# Patient Record
Sex: Male | Born: 1985 | Race: Black or African American | Hispanic: No | Marital: Single | Smoking: Never smoker
Health system: Southern US, Community
[De-identification: ages and names within clinical notes are randomized; demographics above are authoritative.]

---
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
## 2021-03-21 ENCOUNTER — Encounter (HOSPITAL_COMMUNITY): Payer: Self-pay

## 2021-03-21 ENCOUNTER — Emergency Department (HOSPITAL_COMMUNITY): Payer: Self-pay

## 2021-03-21 ENCOUNTER — Emergency Department (HOSPITAL_COMMUNITY)
Admission: EM | Admit: 2021-03-21 | Discharge: 2021-03-21 | Disposition: A | Discharge status: Home / Self Care | Payer: Self-pay | Attending: Emergency Medicine | Admitting: Emergency Medicine

## 2021-03-21 ENCOUNTER — Other Ambulatory Visit: Payer: Self-pay

## 2021-03-21 DIAGNOSIS — R112 Nausea with vomiting, unspecified: Secondary | ICD-10-CM | POA: Insufficient documentation

## 2021-03-21 DIAGNOSIS — Z5321 Procedure and treatment not carried out due to patient leaving prior to being seen by health care provider: Secondary | ICD-10-CM | POA: Insufficient documentation

## 2021-03-21 DIAGNOSIS — Z20822 Contact with and (suspected) exposure to covid-19: Secondary | ICD-10-CM | POA: Insufficient documentation

## 2021-03-21 DIAGNOSIS — R079 Chest pain, unspecified: Secondary | ICD-10-CM | POA: Insufficient documentation

## 2021-03-21 DIAGNOSIS — R5383 Other fatigue: Secondary | ICD-10-CM | POA: Insufficient documentation

## 2021-03-21 DIAGNOSIS — R197 Diarrhea, unspecified: Secondary | ICD-10-CM | POA: Insufficient documentation

## 2021-03-21 DIAGNOSIS — R0602 Shortness of breath: Secondary | ICD-10-CM | POA: Insufficient documentation

## 2021-03-21 LAB — RESP PANEL BY RT-PCR (FLU A&B, COVID) ARPGX2
Influenza A by PCR: NEGATIVE
Influenza B by PCR: NEGATIVE
SARS Coronavirus 2 by RT PCR: NEGATIVE

## 2021-03-21 LAB — COMPREHENSIVE METABOLIC PANEL
ALT: 42 U/L (ref 0–44)
AST: 35 U/L (ref 15–41)
Albumin: 4.2 g/dL (ref 3.5–5.0)
Alkaline Phosphatase: 62 U/L (ref 38–126)
Anion gap: 8 (ref 5–15)
BUN: 16 mg/dL (ref 6–20)
CO2: 27 mmol/L (ref 22–32)
Calcium: 9.1 mg/dL (ref 8.9–10.3)
Chloride: 103 mmol/L (ref 98–111)
Creatinine, Ser: 1.08 mg/dL (ref 0.61–1.24)
GFR, Estimated: >60 mL/min (ref >60)
Glucose, Bld: 114 mg/dL — ABNORMAL HIGH (ref 70–99)
Potassium: 4 mmol/L (ref 3.5–5.1)
Sodium: 138 mmol/L (ref 135–145)
Total Bilirubin: 0.9 mg/dL (ref 0.3–1.2)
Total Protein: 7.6 g/dL (ref 6.5–8.1)

## 2021-03-21 LAB — CBC WITH DIFFERENTIAL/PLATELET
Abs Immature Granulocytes: 0.04 10*3/uL (ref 0.00–0.07)
Basophils Absolute: 0 10*3/uL (ref 0.0–0.1)
Basophils Relative: 0 %
Eosinophils Absolute: 0.1 10*3/uL (ref 0.0–0.5)
Eosinophils Relative: 0 %
HCT: 45.1 % (ref 39.0–52.0)
Hemoglobin: 16.2 g/dL (ref 13.0–17.0)
Immature Granulocytes: 0 %
Lymphocytes Relative: 3 %
Lymphs Abs: 0.4 10*3/uL — ABNORMAL LOW (ref 0.7–4.0)
MCH: 31.3 pg (ref 26.0–34.0)
MCHC: 35.9 g/dL (ref 30.0–36.0)
MCV: 87.2 fL (ref 80.0–100.0)
Monocytes Absolute: 0.4 10*3/uL (ref 0.1–1.0)
Monocytes Relative: 3 %
Neutro Abs: 12.2 10*3/uL — ABNORMAL HIGH (ref 1.7–7.7)
Neutrophils Relative %: 94 %
Platelets: 201 10*3/uL (ref 150–400)
RBC: 5.17 MIL/uL (ref 4.22–5.81)
RDW: 13 % (ref 11.5–15.5)
WBC: 13.1 10*3/uL — ABNORMAL HIGH (ref 4.0–10.5)
nRBC: 0 % (ref 0.0–0.2)

## 2021-03-21 LAB — TROPONIN I (HIGH SENSITIVITY): Troponin I (High Sensitivity): 5 ng/L (ref <18)

## 2021-03-21 MED ORDER — ONDANSETRON 4 MG PO TBDP
4.0000 mg | ORAL_TABLET | Freq: Once | ORAL | Status: AC
  Administered 2021-03-21: 4 mg via ORAL
  Filled 2021-03-21: qty 1

## 2021-03-21 NOTE — ED Triage Notes (Signed)
Patient arrives POV, c/o CP, SOB, and fatigue beginning at 1830. Patient endorses that son recently had stomach flu.  Patient additionally endorses N/V/D beginning at 1830

## 2021-03-21 NOTE — ED Notes (Signed)
Patient states he will download mychart and call medical records for results. States he just wants to go home and lie down.

## 2021-03-21 NOTE — ED Provider Triage Note (Signed)
Emergency Medicine Provider Triage Evaluation Note  Chase Ortega , a 36 y.o. male  was evaluated in triage.  Pt complains of dyspnea that began last night. Reports he feels short of breath with fatigue, has had sweats/chills, N/V/D and chest pain, central pain described as tightness. No alleviating/aggravating factors. .  Review of Systems  Per above  Physical Exam  BP (!) 148/85 (BP Location: Left Arm)    Pulse 77    Temp 97.8 F (36.6 C) (Oral)    Resp 18    SpO2 100%  Gen:   Awake, no distress   Resp:  Normal effort  MSK:   Moves extremities without difficulty  Other:  Anterior chest wall TTP. Abdominal exam w/o peritoneal signs.   Medical Decision Making  Medically screening exam initiated at 5:27 AM.  Appropriate orders placed.  Chase Ortega was informed that the remainder of the evaluation will be completed by another provider, this initial triage assessment does not replace that evaluation, and the importance of remaining in the ED until their evaluation is complete.  N/V/D, dyspnea    Cherly Anderson, PA-C 03/21/21 [id number]

## 2021-07-02 ENCOUNTER — Emergency Department (HOSPITAL_COMMUNITY)
Admission: EM | Admit: 2021-07-02 | Discharge: 2021-07-02 | Disposition: A | Discharge status: Home / Self Care | Payer: Self-pay | Attending: Emergency Medicine | Admitting: Emergency Medicine

## 2021-07-02 ENCOUNTER — Other Ambulatory Visit: Payer: Self-pay

## 2021-07-02 ENCOUNTER — Emergency Department (HOSPITAL_COMMUNITY): Payer: Self-pay

## 2021-07-02 ENCOUNTER — Encounter (HOSPITAL_COMMUNITY): Payer: Self-pay | Admitting: Emergency Medicine

## 2021-07-02 DIAGNOSIS — S42202A Unspecified fracture of upper end of left humerus, initial encounter for closed fracture: Secondary | ICD-10-CM | POA: Insufficient documentation

## 2021-07-02 MED ORDER — METHOCARBAMOL 500 MG PO TABS: 500.0000 mg | ORAL_TABLET | Freq: Two times a day (BID) | ORAL | 0 refills | Status: AC

## 2021-07-02 MED ORDER — OXYCODONE-ACETAMINOPHEN 5-325 MG PO TABS
1.0000 | ORAL_TABLET | Freq: Once | ORAL | Status: AC
  Administered 2021-07-02: 1 via ORAL
  Filled 2021-07-02: qty 1

## 2021-07-02 MED ORDER — OXYCODONE-ACETAMINOPHEN 5-325 MG PO TABS: 1.0000 | ORAL_TABLET | Freq: Four times a day (QID) | ORAL | 0 refills | Status: AC | PRN
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
## 2021-07-02 NOTE — Discharge Instructions (Signed)
Call orthopedics on Monday to schedule as appointment. ?Apply ice for 20 minutes at a time to help with pain and swelling.  ?Wear immobilizer, may remove carefully with assistance to bathe, limit movement of the shoulder. ?Take Motrin for pain as needed as directed. ?Take percocet for pain not controlled with Motrin. Do not drive or operate machinery if taking percocet.  ?Take Robaxin as needed for pain and muscle spasms, do not drive or operate machinery if taking.  ? ?
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
## 2021-07-02 NOTE — ED Notes (Signed)
Ortho Tech called for pt's shoulder sling. ?

## 2021-07-02 NOTE — Progress Notes (Signed)
Orthopedic Tech Progress Note ?Patient Details:  ?Chase Ortega ?[birth year] ?[id number] ? ? ?Ortho Devices ?Type of Ortho Device: Shoulder immobilizer ?Ortho Device/Splint Location: LUE ?Ortho Device/Splint Interventions: Ordered, Application, Adjustment ?  ?Post Interventions ?Patient Tolerated: Poor ?Instructions Provided: Adjustment of device, Care of device ? ?Chase Ortega ?07/02/2021, 7:47 AM ? ?

## 2022-10-21 IMAGING — CR DG CHEST 2V
2 series · 2 of 2 positions shown · non-contrast
Comparison: None.

CLINICAL DATA: Chest pain and dyspnea

EXAM:
CHEST - 2 VIEW

[chest pa]
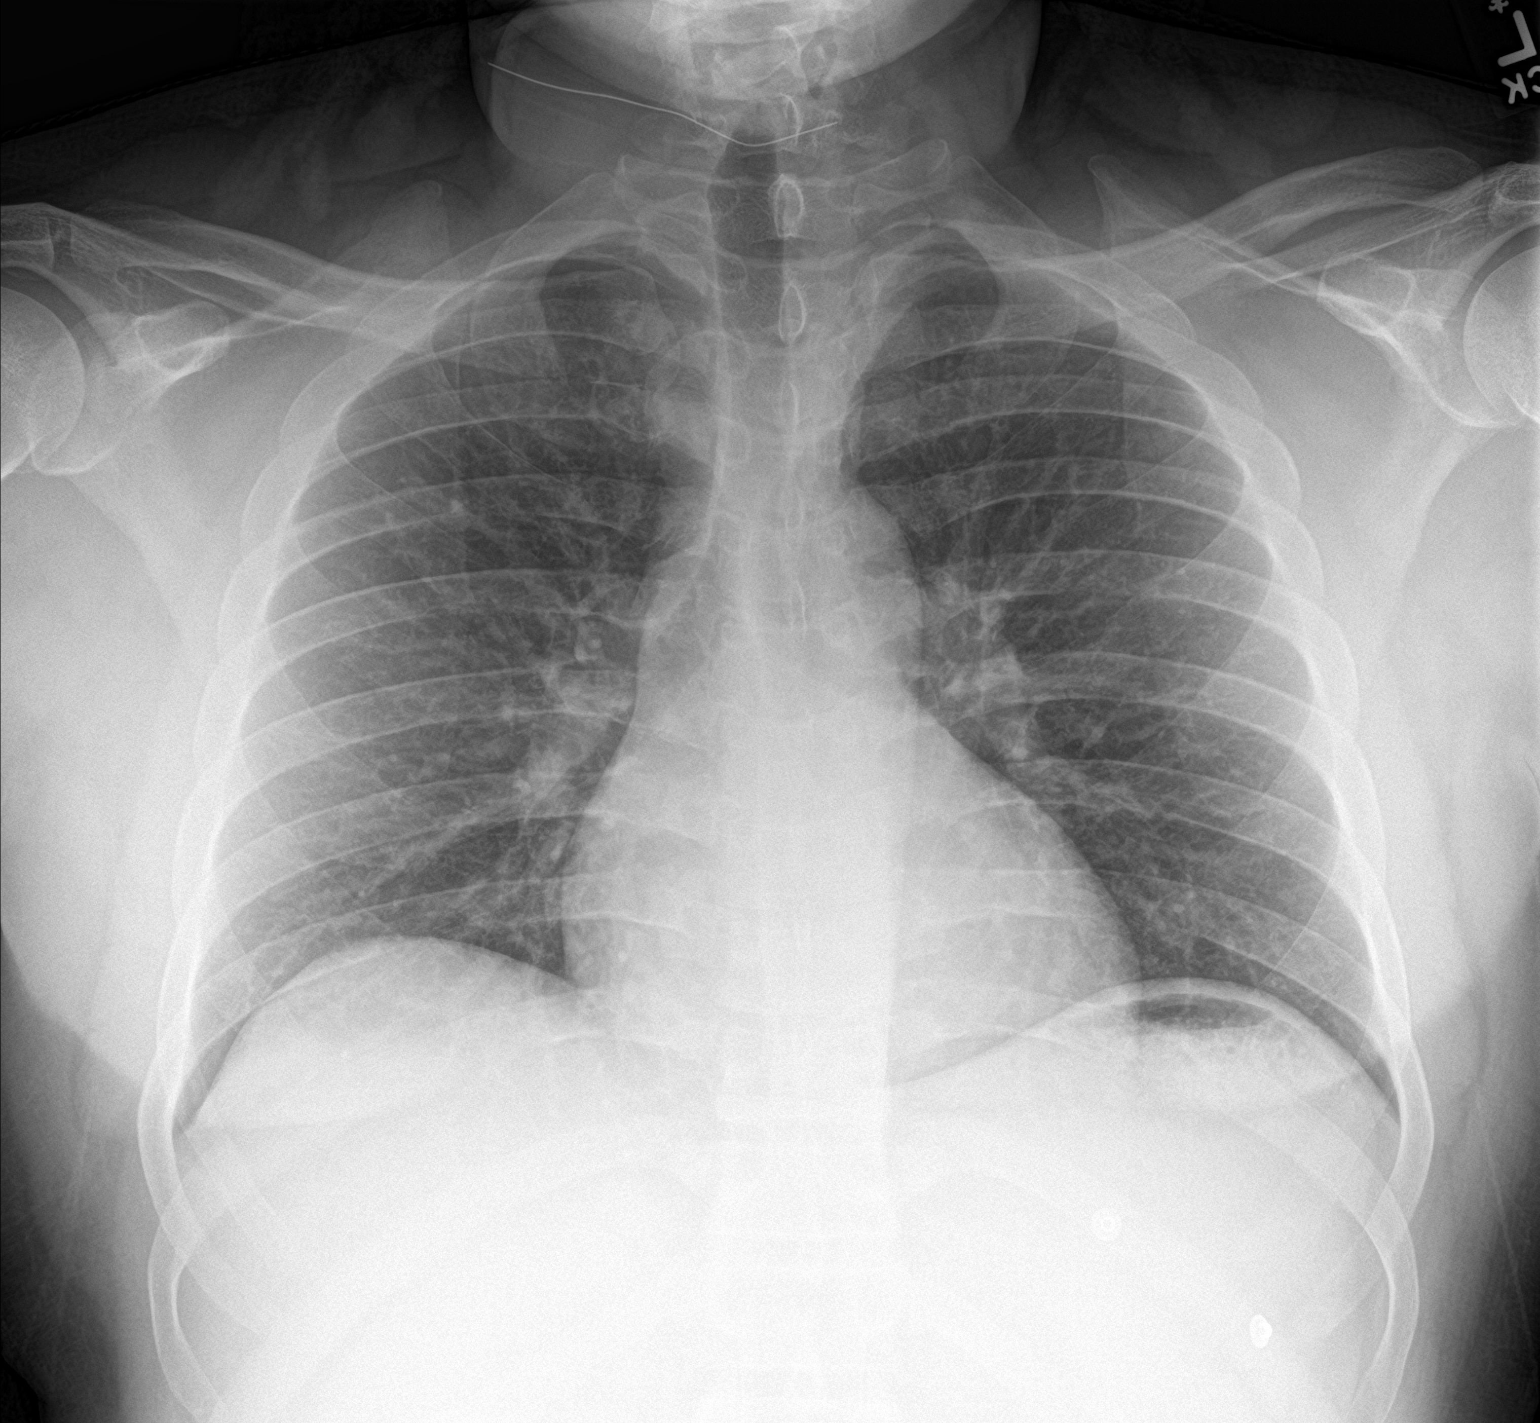

[chest lat]
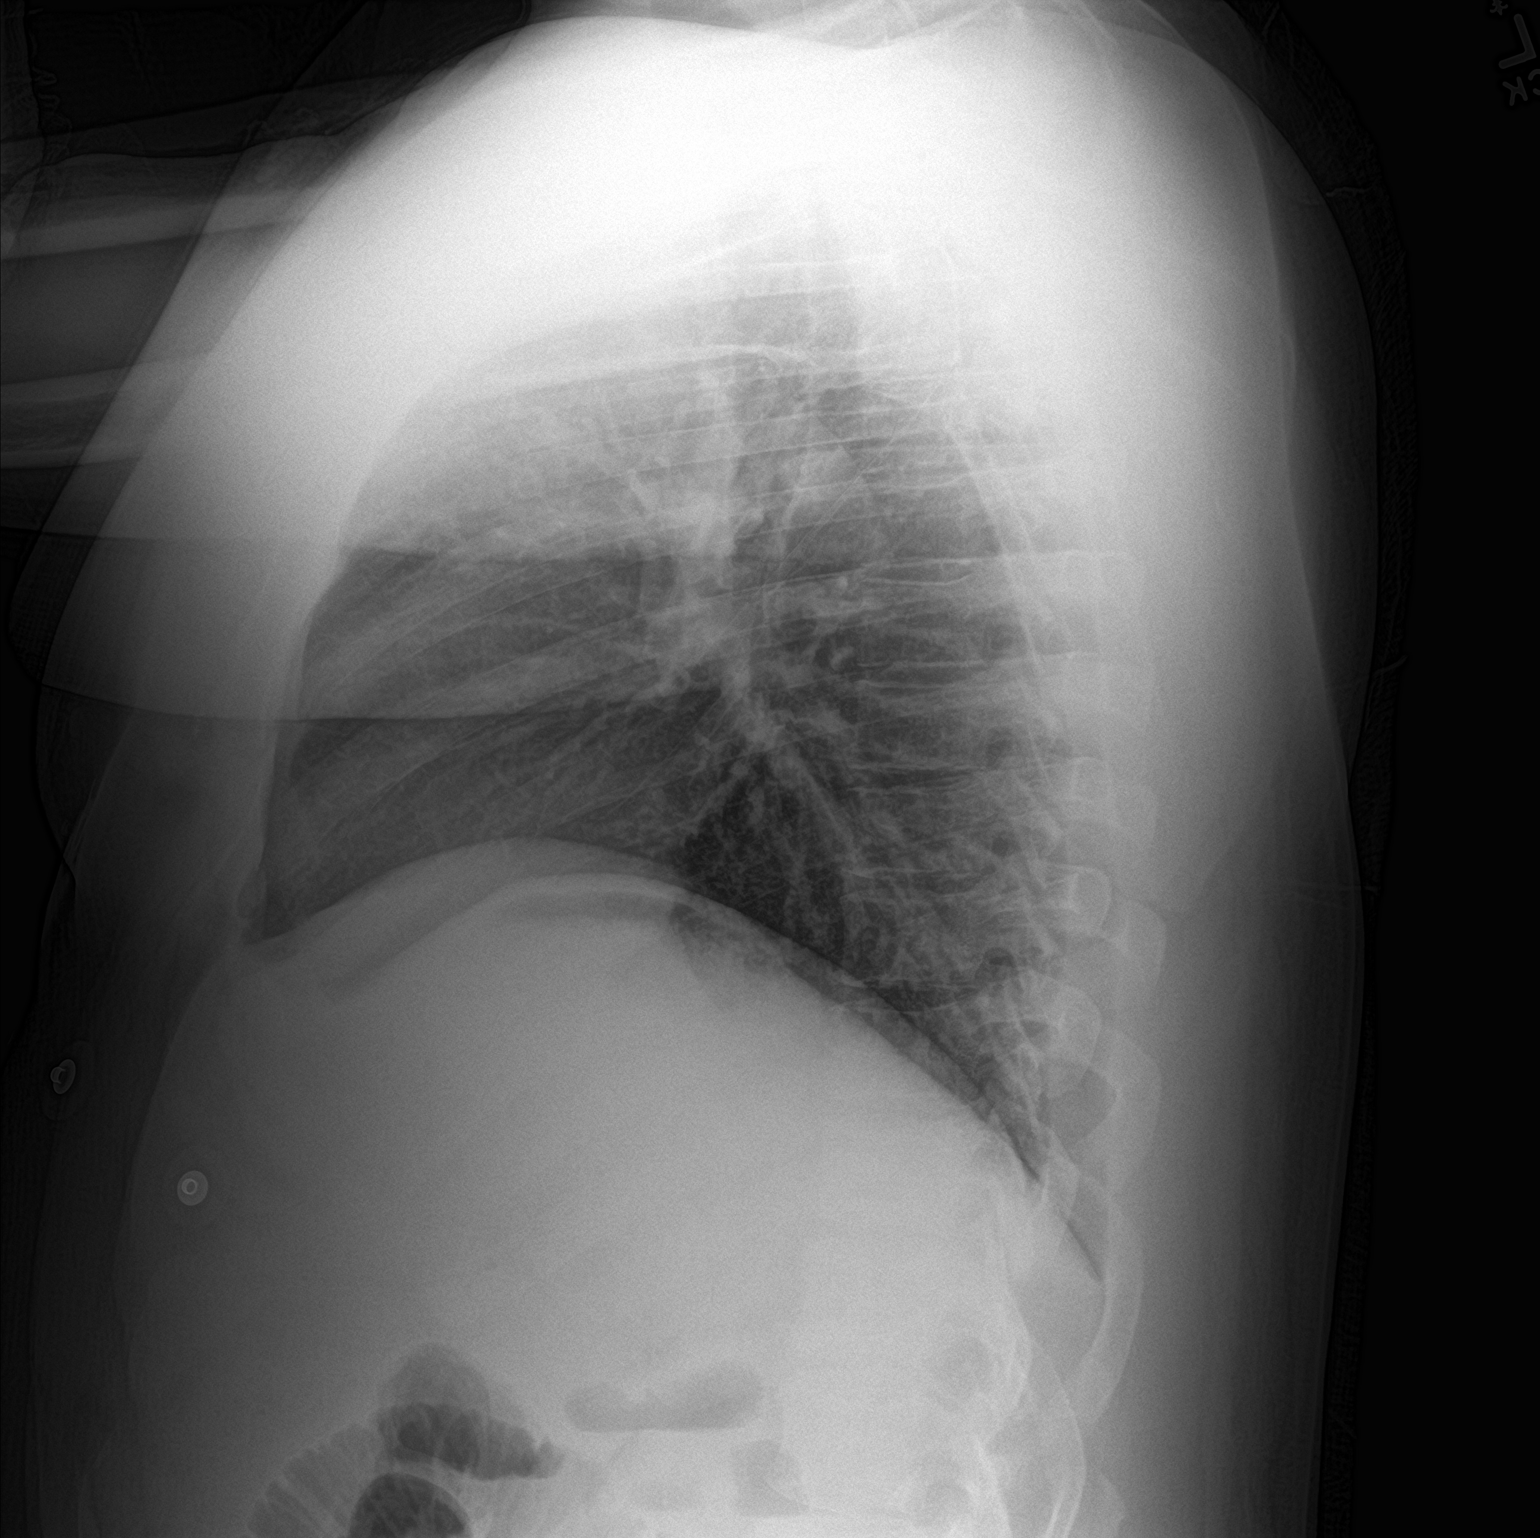

[2 of 2 positions shown; findings below may reference images not displayed]

FINDINGS: The heart size and mediastinal contours are within normal limits.
Both lungs are clear. The visualized skeletal structures are
unremarkable.
IMPRESSION: No active cardiopulmonary disease.
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
